# Patient Record
Sex: Male | Born: 1955 | Race: White | Hispanic: No | Marital: Single
Health system: Southern US, Community
[De-identification: ages and names within clinical notes are randomized; demographics above are authoritative.]

## PROBLEM LIST (undated history)

## (undated) DIAGNOSIS — E785 Hyperlipidemia, unspecified: Secondary | ICD-10-CM

---

## 2010-11-04 ENCOUNTER — Observation Stay (HOSPITAL_COMMUNITY)
Admission: EM | Admit: 2010-11-04 | Discharge: 2010-11-04 | Disposition: A | Discharge status: Home / Self Care | Payer: Self-pay | Attending: Orthopedic Surgery | Admitting: Orthopedic Surgery

## 2010-11-04 ENCOUNTER — Emergency Department (HOSPITAL_COMMUNITY): Payer: Self-pay

## 2010-11-04 DIAGNOSIS — S62639B Displaced fracture of distal phalanx of unspecified finger, initial encounter for open fracture: Principal | ICD-10-CM | POA: Insufficient documentation

## 2010-11-04 DIAGNOSIS — Z23 Encounter for immunization: Secondary | ICD-10-CM | POA: Insufficient documentation

## 2010-11-04 DIAGNOSIS — F17200 Nicotine dependence, unspecified, uncomplicated: Secondary | ICD-10-CM | POA: Insufficient documentation

## 2010-11-04 DIAGNOSIS — Y92009 Unspecified place in unspecified non-institutional (private) residence as the place of occurrence of the external cause: Secondary | ICD-10-CM | POA: Insufficient documentation

## 2010-11-04 DIAGNOSIS — W312XXA Contact with powered woodworking and forming machines, initial encounter: Secondary | ICD-10-CM | POA: Insufficient documentation

## 2010-12-01 NOTE — Op Note (Signed)
[name], [name]              ACCOUNT NO.:  [id number]  MEDICAL RECORD NO.:  [id number]           PATIENT TYPE:  O  LOCATION:  2550                         FACILITY:  [facility]  PHYSICIAN:  Madelynn Done, MD  DATE OF BIRTH:  [birth year]  DATE OF PROCEDURE:  11/04/2010 DATE OF DISCHARGE:  11/04/2010                              OPERATIVE REPORT   PREOPERATIVE DIAGNOSES: 1. Left index finger open distal phalanx fracture from table saw     injury. 2. Left thumb open distal phalanx fracture with soft tissue loss from     table saw injury.  ANESTHESIA:  General via LMA.  TOURNIQUET TIME:  Less than 1 hour at 250 mmHg.  SURGICAL PROCEDURES: 1. Left index finger open debridement of skin, subcutaneous tissue,     and bone; left index finger. 2. Left index finger advancement flap and rotation flap closure. 3. Left index finger nail bed repair. 4. Left thumb debridement of skin, subcutaneous tissue, and bone     associated with open fracture. 5. Left thumb full-thickness skin grafting 1.5 x 1.5 skin graft from     the hypothenar eminence.  SURGICAL INDICATIONS:  Manuel Harris is a 55 year old gentleman who was at home working on the table saw when his watch got caught on the table saw, pulled his thumb and index finger into the table saw sustaining the above-mentioned injuries.  The patient was seen and evaluated in the emergency department.  Based on this level of injury, it was recommended that he undergo the above procedures.  Risks, benefits, and alternatives were discussed in detail with the patient and signed informed consent was obtained.  Risks include, but not limited to bleeding, infection, damage to nearby nerves, arteries or tendons, loss of motion of wrist and digits, and need for further surgical intervention.  DESCRIPTION OF PROCEDURE:  The patient was properly identified in the preop holding area, mark with a permanent marker made on the left index finger and  thumb to indicate correct operative sites.  The patient then brought back to the operating room, placed supine on the anesthesia room table where general anesthesia was administered.  The patient received preoperative antibiotics prior to any skin incision.  Well-padded tourniquet was then placed on the left brachium and sealed with a 1000 drape.  Left upper extremity was prepped and draped in normal sterile fashion.  Time-out was called, correct side was identified, and procedure then begun.  Attention then turned to the left thumb left index finger where debridement of skin, subcutaneous tissue, and bone was then carried out of the open fracture.  Several large fracture fragments that were very little soft tissue attachments were then removed.  The bone was then taken back and portion of the nail bed was then excised.  The bone was shortened preserving the FDP insertion. After thorough sharp debridement with rongeurs and scissors, 2 advancement flaps were then created in a cutlet-type fashion.  This did close the skin over the exposed bone.  This was reamed to the nail bed was then repaired to the skin with simple chromic sutures.  After nail bed  repair, skin flaps again were then advanced and then final wound closed with good soft tissue coverage with 5-0 chromic and 5-0 Prolene sutures.  Following this, attention was then turned to the thumb, open debridement of the fracture over the distal open distal phalanx was then carried out sharply with the curettes, rongeurs.  The thumb was then thoroughly irrigated.  The patient did have 1.5 x 1.5 soft tissue defect over the ulnar aspect of the thumb.  After this was adequately debrided, full-thickness skin graft was then harvested.  This after harvesting of skin graft, skin graft donor site was then closed with simple Prolene sutures.  Skin graft was then adequately repaired, taking the fat off the skin grafting nicely and lying it over the  defect.  This was then sewn to the defected area with chromic sutures.  Following this, Adaptic dressings were then applied.  Sterile compressive bandage then applied. The tourniquet was deflated with good perfusion of the fingertips.  The patient was then extubated and taken to recovery room in good condition.  POSTOPERATIVE PLAN:  The patient discharged to home, seen back in the office in 1 week for wound check, tip protector splints, sutures out likely at the 3-week mark, and gradual increase in activity as the patient tolerates.     Madelynn Done, MD     FWO/MEDQ  D:  11/04/2010  T:  11/05/2010  Job:  [id number]  Electronically Signed by Bradly Bienenstock IV MD on 12/01/2010 05:38:32 PM

## 2010-12-15 NOTE — Discharge Summary (Signed)
  [name], Manuel Harris              ACCOUNT NO.:  [id number]  MEDICAL RECORD NO.:  [id number]           PATIENT TYPE:  O  LOCATION:  2550                         FACILITY:  [facility]  PHYSICIAN:  Madelynn Done, MD  DATE OF BIRTH:  April 19, 1956  DATE OF ADMISSION:  11/04/2010 DATE OF DISCHARGE:  11/04/2010                              DISCHARGE SUMMARY   Manuel Harris sustained an injury to his finger.  The patient was seen and evaluated in the emergency department and then went home.  He was not admitted to the hospital.  He was discharged to home following this procedure in the operating room.  The operative notes demonstrate the procedure.  He came into the ER, had an operation, then went home.     Madelynn Done, MD     FWO/MEDQ  D:  12/01/2010  T:  12/02/2010  Job:  [id number]  Electronically Signed by Bradly Bienenstock IV MD on 12/15/2010 09:05:27 AM

## 2012-08-01 IMAGING — CR DG HAND COMPLETE 3+V*L*
3 series · 3 of 3 positions shown · non-contrast
Comparison: None.

CLINICAL DATA: Injury to left hand

LEFT HAND - COMPLETE 3+ VIEW

[x hand pa left]
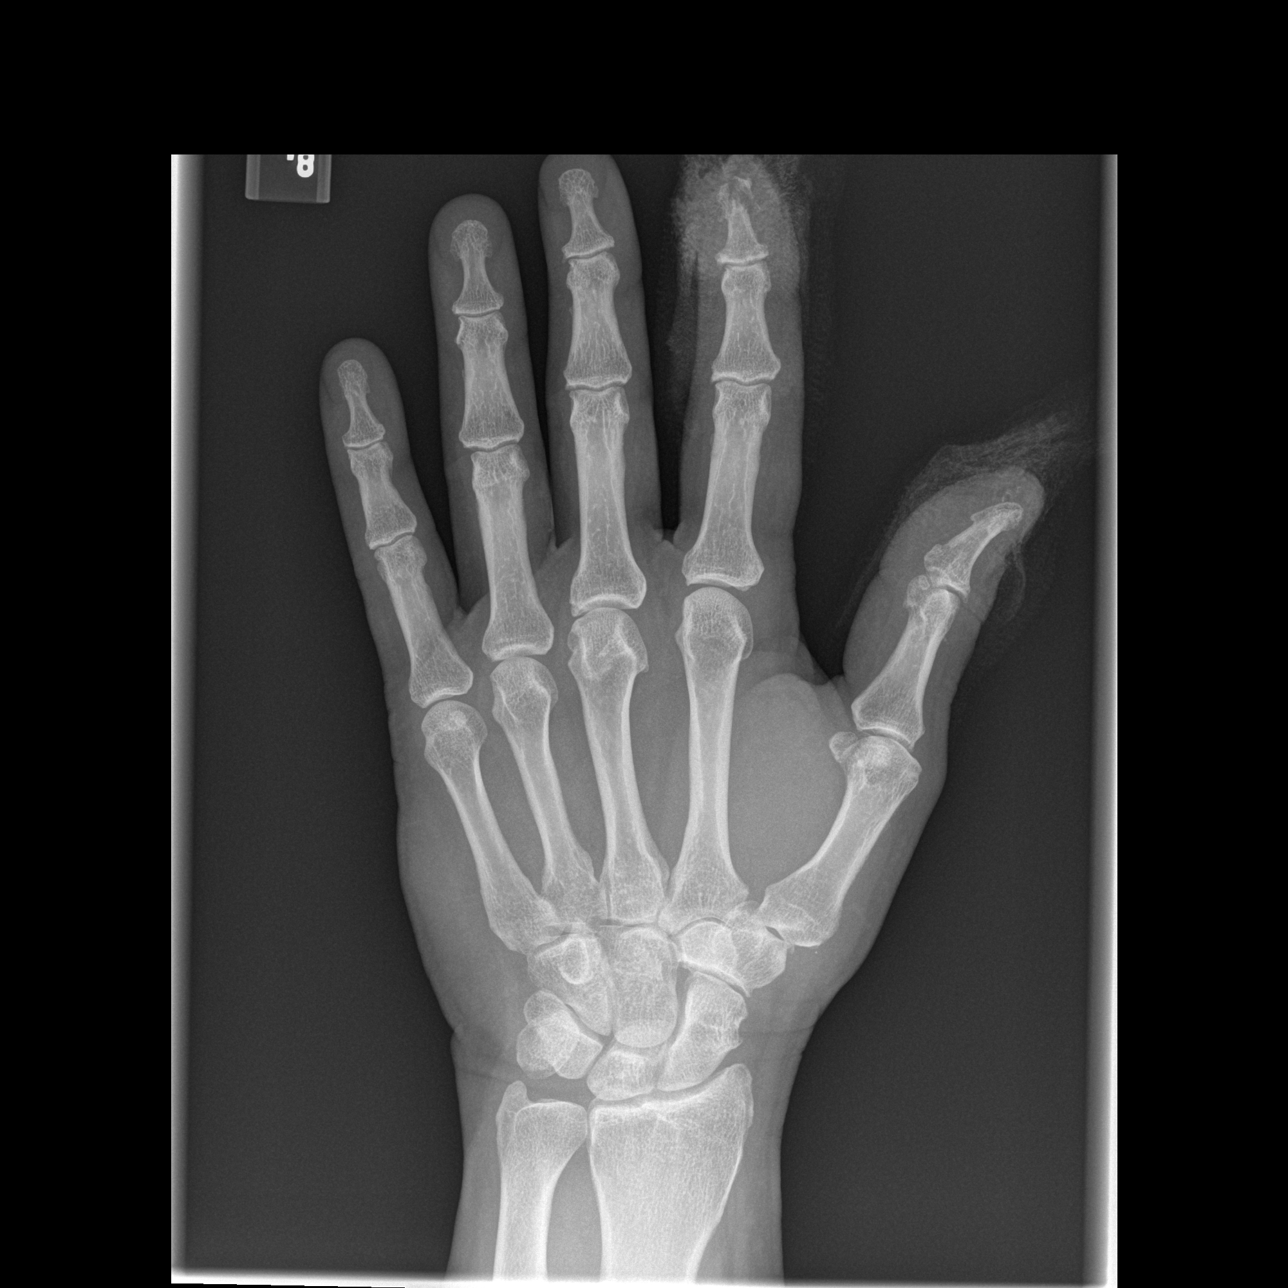

[x hand oblique left]
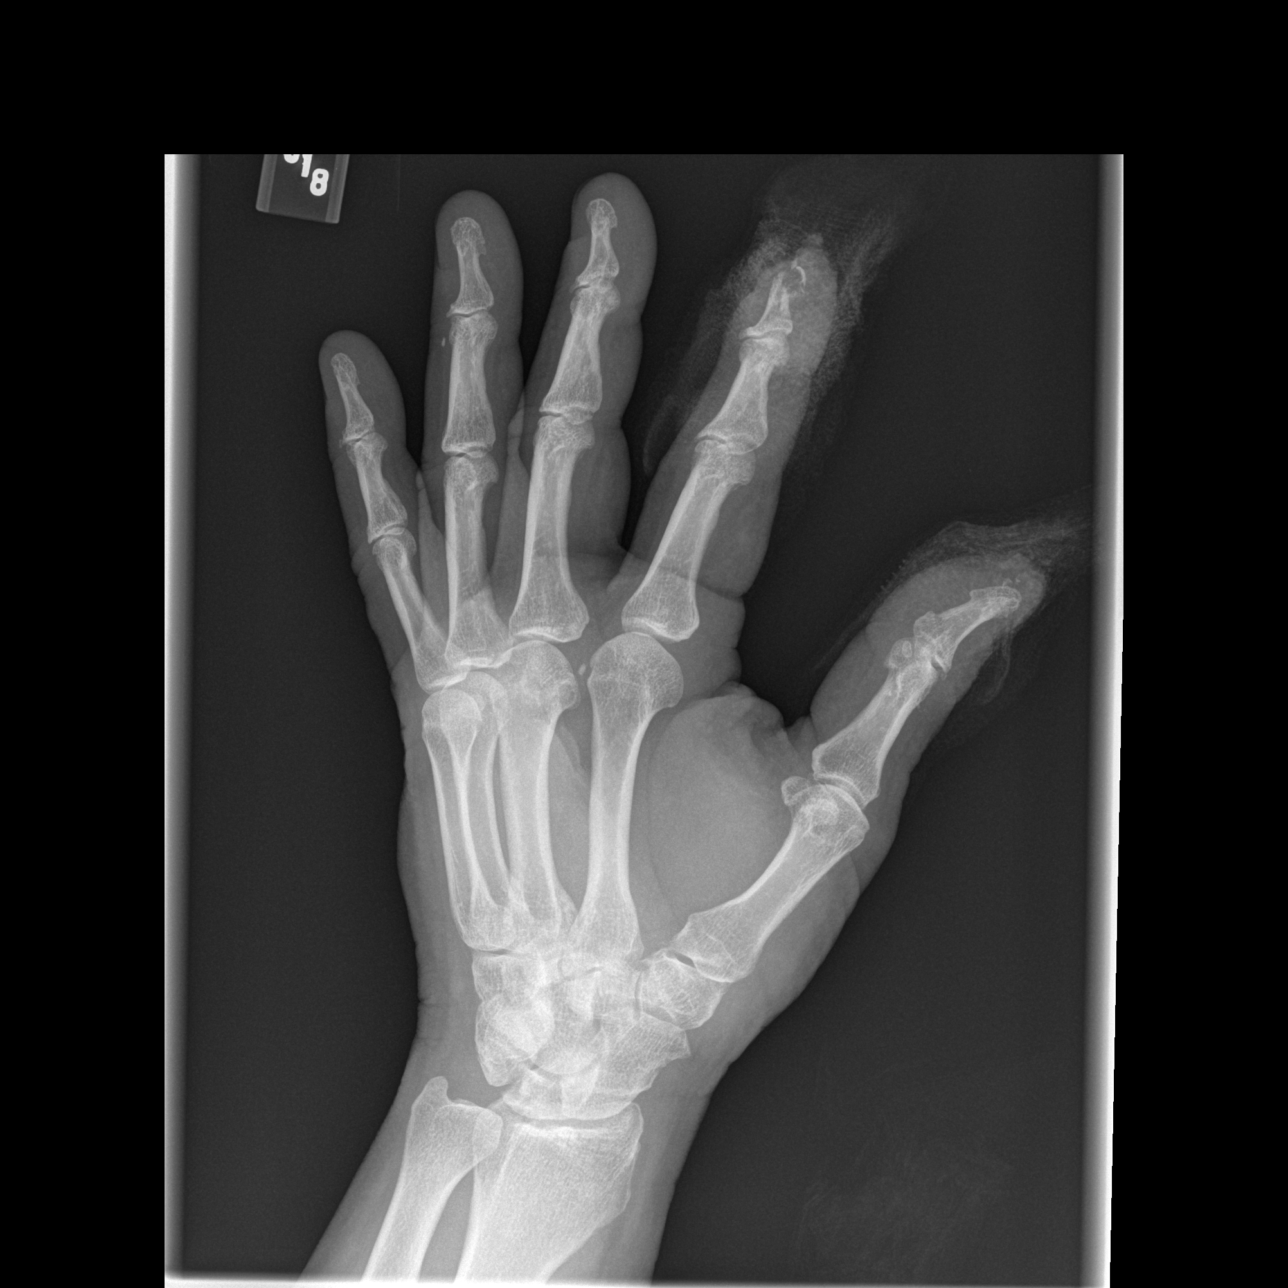

[x hand lat left]
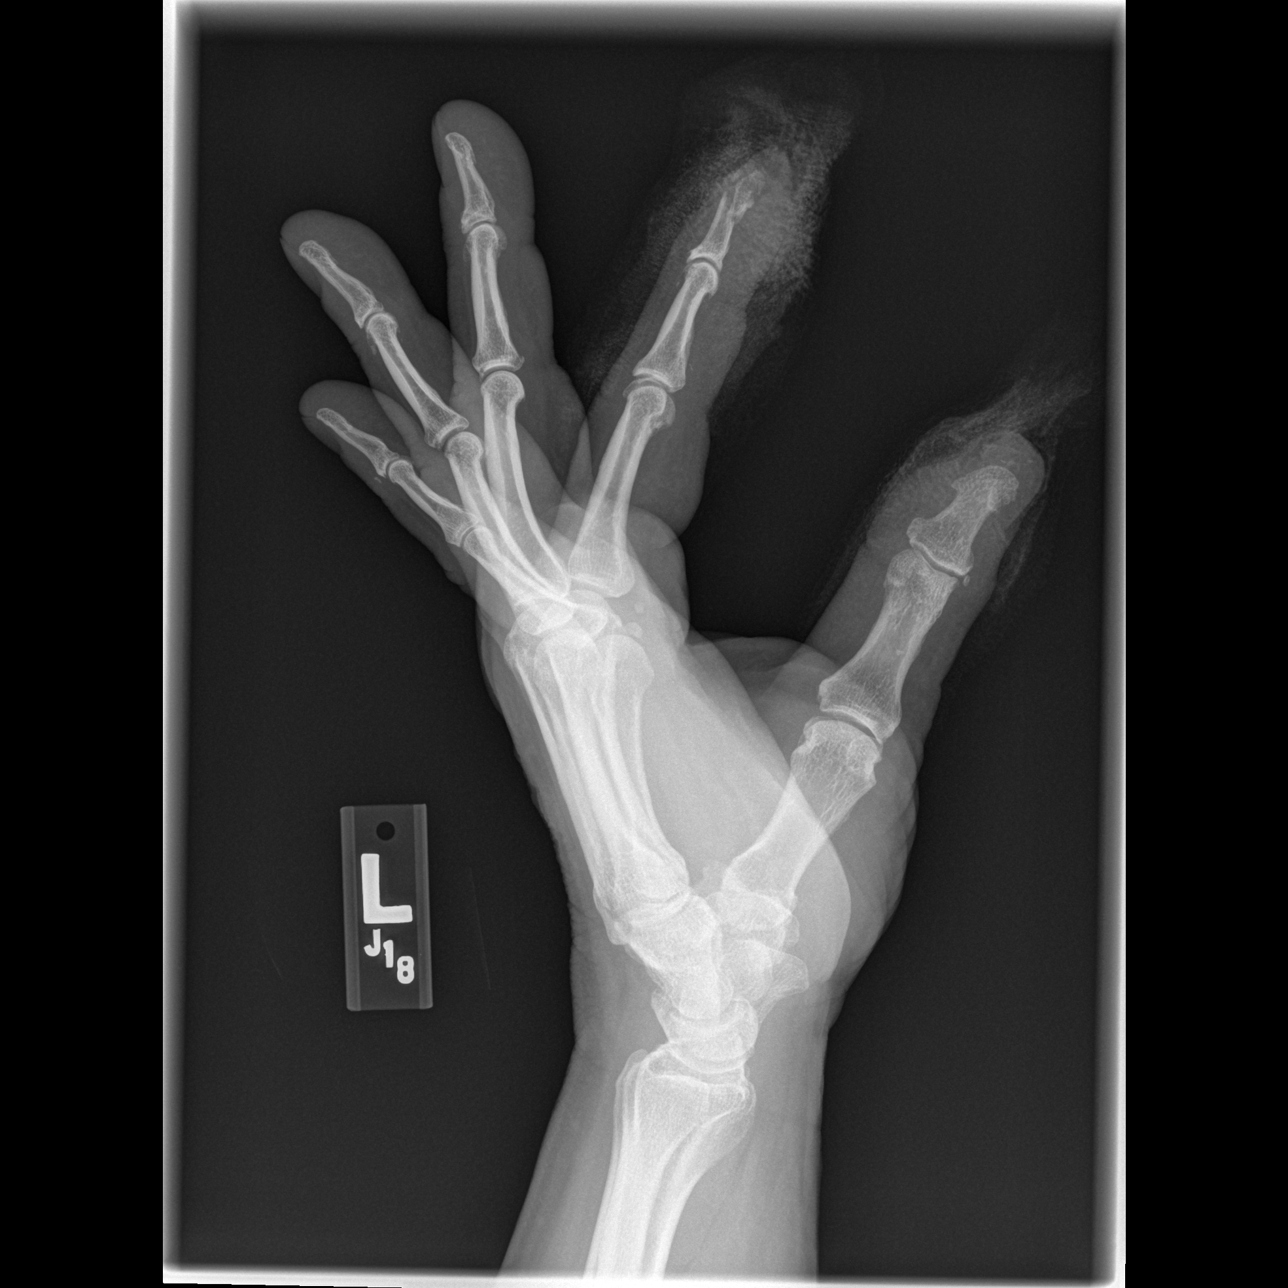

[3 of 3 positions shown; findings below may reference images not displayed]

FINDINGS: There is a comminuted tuft fracture of the second digit.
There is a soft tissue injury to the distal first digit but no
definite fracture.  There is radiopaque foreign body in the soft
tissues.
IMPRESSION: 1.  Comminuted tuft fracture of the second digit.
2.  Soft tissue injury to the distal first digit with radiopaque
debris.  No definite fracture.

## 2017-10-25 ENCOUNTER — Encounter: Payer: Self-pay | Admitting: Gastroenterology

## 2022-09-26 ENCOUNTER — Other Ambulatory Visit: Payer: Self-pay

## 2022-09-26 ENCOUNTER — Encounter: Payer: Self-pay | Admitting: Emergency Medicine

## 2022-09-26 ENCOUNTER — Emergency Department
Admission: EM | Admit: 2022-09-26 | Discharge: 2022-09-26 | Disposition: A | Discharge status: Home / Self Care | Payer: Managed Care, Other (non HMO) | Attending: Emergency Medicine | Admitting: Emergency Medicine

## 2022-09-26 DIAGNOSIS — L03115 Cellulitis of right lower limb: Secondary | ICD-10-CM | POA: Diagnosis not present

## 2022-09-26 DIAGNOSIS — M79604 Pain in right leg: Secondary | ICD-10-CM | POA: Diagnosis present

## 2022-09-26 HISTORY — DX: Hyperlipidemia, unspecified: E78.5

## 2022-09-26 LAB — CBC WITH DIFFERENTIAL/PLATELET
Abs Immature Granulocytes: 0.02 10*3/uL (ref 0.00–0.07)
Basophils Absolute: 0 10*3/uL (ref 0.0–0.1)
Basophils Relative: 0 %
Eosinophils Absolute: 0.2 10*3/uL (ref 0.0–0.5)
Eosinophils Relative: 3 %
HCT: 39.6 % (ref 39.0–52.0)
Hemoglobin: 13.4 g/dL (ref 13.0–17.0)
Immature Granulocytes: 0 %
Lymphocytes Relative: 22 %
Lymphs Abs: 1.5 10*3/uL (ref 0.7–4.0)
MCH: 30.9 pg (ref 26.0–34.0)
MCHC: 33.8 g/dL (ref 30.0–36.0)
MCV: 91.5 fL (ref 80.0–100.0)
Monocytes Absolute: 0.4 10*3/uL (ref 0.1–1.0)
Monocytes Relative: 6 %
Neutro Abs: 4.9 10*3/uL (ref 1.7–7.7)
Neutrophils Relative %: 69 %
Platelets: 218 10*3/uL (ref 150–400)
RBC: 4.33 MIL/uL (ref 4.22–5.81)
RDW: 12.1 % (ref 11.5–15.5)
WBC: 7 10*3/uL (ref 4.0–10.5)
nRBC: 0 % (ref 0.0–0.2)

## 2022-09-26 LAB — COMPREHENSIVE METABOLIC PANEL
ALT: 16 U/L (ref 0–44)
AST: 22 U/L (ref 15–41)
Albumin: 3.5 g/dL (ref 3.5–5.0)
Alkaline Phosphatase: 83 U/L (ref 38–126)
Anion gap: 6 (ref 5–15)
BUN: 24 mg/dL — ABNORMAL HIGH (ref 8–23)
CO2: 23 mmol/L (ref 22–32)
Calcium: 8.8 mg/dL — ABNORMAL LOW (ref 8.9–10.3)
Chloride: 105 mmol/L (ref 98–111)
Creatinine, Ser: 0.77 mg/dL (ref 0.61–1.24)
GFR, Estimated: >60 mL/min (ref >60)
Glucose, Bld: 96 mg/dL (ref 70–99)
Potassium: 4.3 mmol/L (ref 3.5–5.1)
Sodium: 134 mmol/L — ABNORMAL LOW (ref 135–145)
Total Bilirubin: 0.6 mg/dL (ref 0.3–1.2)
Total Protein: 7.1 g/dL (ref 6.5–8.1)

## 2022-09-26 LAB — LACTIC ACID, PLASMA: Lactic Acid, Venous: 1.3 mmol/L (ref 0.5–1.9)

## 2022-09-26 MED ORDER — SULFAMETHOXAZOLE-TRIMETHOPRIM 800-160 MG PO TABS: 1.0000 | ORAL_TABLET | Freq: Two times a day (BID) | ORAL | 0 refills | Status: AC

## 2022-09-26 MED ORDER — CEPHALEXIN 500 MG PO CAPS: 500.0000 mg | ORAL_CAPSULE | Freq: Four times a day (QID) | ORAL | 0 refills | Status: AC

## 2022-09-26 MED ORDER — OXYCODONE HCL 5 MG PO TABS: 5.0000 mg | ORAL_TABLET | Freq: Four times a day (QID) | ORAL | 0 refills | Status: AC | PRN

## 2022-09-26 NOTE — Discharge Instructions (Addendum)
To the antibiotics for 5 days and if things or not getting better then follow-up with your primary care doctor for reevaluation or if you feel you may need additional antibiotics she can follow-up with them if it is improving but not fully healed up.  Return to the ER if develop worsening symptoms fevers or any other concerns.  We discussed DVT ultrasound today but you have opted to decline but if you notice worsening swelling in the leg shortness of breath please return to the ER immediately   Take oxycodone as prescribed. Do not drink alcohol, drive or participate in any other potentially dangerous activities while taking this medication as it may make you sleepy. Do not take this medication with any other sedating medications, either prescription or over-the-counter. If you were prescribed Percocet or Vicodin, do not take these with acetaminophen (Tylenol) as it is already contained within these medications.  This medication is an opiate (or narcotic) pain medication and can be habit forming. Use it as little as possible to achieve adequate pain control. Do not use or use it with extreme caution if you have a history of opiate abuse or dependence. If you are on a pain contract with your primary care doctor or a pain specialist, be sure to let them know you were prescribed this medication today from the Emergency Department. This medication is intended for your use only - do not give any to anyone else and keep it in a secure place where nobody else, especially children, have access to it.

## 2022-09-26 NOTE — ED Provider Notes (Signed)
[facility] Provider Note    Event Date/Time   First MD Initiated Contact with Patient 09/26/22 1047     (approximate)   History   Leg Pain   HPI  Manuel Harris is a 67 y.o. male with OSA who comes in with concern for right leg infection.  Patient has a wound noted to the right lower leg reports that he scraped it at work last week and this is all started.  Patient reports being seen by his primary care doctor before for leg swelling being told that he had chronic venous stasis.  He has been on Lasix denies any worsening swelling except for a little bit more in the right leg after having this new injury.  He reports that his right leg swelled up before when he had an injury of it where he hit himself and caused an infection and some swelling.  He states that similar thing happened where about a week ago he bumped his leg into something and developed an ulceration on it and wound and then it started to get infected with the redness which is called increasing pain.  He is able to ambulate on it denies any fevers.  Denies any chest pain, shortness of breath.   Physical Exam   Triage Vital Signs: ED Triage Vitals  Enc Vitals Group     BP 09/26/22 1009 (!) 149/102     Pulse Rate 09/26/22 1009 78     Resp 09/26/22 1009 18     Temp 09/26/22 1009 97.8 F (36.6 C)     Temp Source 09/26/22 1009 Oral     SpO2 09/26/22 1009 98 %     Weight 09/26/22 1007 279 lb (126.6 kg)     Height 09/26/22 1007 6' (1.829 m)     Head Circumference --      Peak Flow --      Pain Score 09/26/22 1007 6     Pain Loc --      Pain Edu? --      Excl. in [place]? --     Most recent vital signs: Vitals:   09/26/22 1009  BP: (!) 149/102  Pulse: 78  Resp: 18  Temp: 97.8 F (36.6 C)  SpO2: 98%     General: Awake, no distress.  CV:  Good peripheral perfusion.  Resp:  Normal effort.  Abd:  No distention.  Other:  1+ edema bilaterally may be slightly worse on the right.  Some  ulcerations that are shallow are noted on the shin where patient reports that he bumped into an object.  There is some redness and warmth around these a little bit of clear drainage.   ED Results / Procedures / Treatments   Labs (all labs ordered are listed, but only abnormal results are displayed) Labs Reviewed  COMPREHENSIVE METABOLIC PANEL - Abnormal; Notable for the following components:      Result Value   Sodium 134 (*)    BUN 24 (*)    Calcium 8.8 (*)    All other components within normal limits  LACTIC ACID, PLASMA  CBC WITH DIFFERENTIAL/PLATELET  LACTIC ACID, PLASMA   PROCEDURES:  Critical Care performed: No  Procedures   MEDICATIONS ORDERED IN ED: Medications - No data to display   IMPRESSION / MDM / [place] / ED COURSE  I reviewed the triage vital signs and the nursing notes.   Patient's presentation is most consistent with acute, uncomplicated illness.  Differential is cellulitis.  Patient has good pulse doubt arterial issue.  We discussed ultrasound to rule out DVT.  Patient declined stating this exact thing happened when he had a wound from an accident previously and had negative DVT ultrasound at that time.  He understands the risk for DVT and causing blood clots and death but does not want to do an ultrasound at this time.  He states that he just feels like he needs antibiotics at [facility] the antibiotics seem to help clear it up.  Labs were ordered as well from triage.  Patient is slightly hypertensive he can follow this up with his primary care doctor.  No rapid expansion or crepitus to suggest necrotizing fasciitis.  Patient able to ambulate doubt any fracture.  CBC shows white count of 7.  Lactate normal.  CMP shows normal liver test.  Given a little bit of clear discharge we will add on Bactrim just to cover for possible MRSA.  Offered wound care referral but patient declined  Considered admission but patient well-appearing nonseptic appearing  and feels comfortable with discharge home on antibiotics.  We will prescribe a few pain medications but understands not to drive or work while on them  FINAL CLINICAL IMPRESSION(S) / ED DIAGNOSES   Final diagnoses:  Cellulitis of right lower extremity     Rx / DC Orders   ED Discharge Orders          Ordered    oxyCODONE (ROXICODONE) 5 MG immediate release tablet  Every 6 hours PRN        09/26/22 1118    cephALEXin (KEFLEX) 500 MG capsule  4 times daily        09/26/22 1118    sulfamethoxazole-trimethoprim (BACTRIM DS) 800-160 MG tablet  2 times daily        09/26/22 1118             Note:  This document was prepared using Dragon voice recognition software and may include unintentional dictation errors.   Vanessa Dover, MD 09/26/22 [phone]

## 2022-09-26 NOTE — ED Triage Notes (Signed)
Pt via POV from home. Pt c/o R leg infection. Redness, swelling, and wound noted to the R lower leg. States he scraped his leg at work last week and this is what started it all. States that 4-5 months ago he was treated for cellulitis but the swelling never fully went away. Denies hx of DM. Pt states he also has his of liver insufficiency that causes his bilateral legs to swell. Pt is A&OX4 and NAD

## 2022-09-26 NOTE — ED Notes (Signed)
Went over d/c paperwork at this time with patient. Pt had no questions, comments or concerns after review and verbally understood them.

## 2024-03-08 ENCOUNTER — Ambulatory Visit (HOSPITAL_COMMUNITY)
Admission: RE | Admit: 2024-03-08 | Discharge: 2024-03-08 | Disposition: A | Discharge status: Home / Self Care | Source: Ambulatory Visit | Attending: Vascular Surgery | Admitting: Vascular Surgery

## 2024-03-08 ENCOUNTER — Other Ambulatory Visit (HOSPITAL_COMMUNITY): Payer: Self-pay | Admitting: Orthopedic Surgery

## 2024-03-08 DIAGNOSIS — R0989 Other specified symptoms and signs involving the circulatory and respiratory systems: Secondary | ICD-10-CM

## 2024-03-12 LAB — VAS US ABI WITH/WO TBI
Left ABI: 1.28
Right ABI: 1.09
